# Patient Record
Sex: Female | Born: 1960 | Race: White | Hispanic: No | Smoking: Current every day smoker
Health system: Southern US, Community
[De-identification: ages and names within clinical notes are randomized; demographics above are authoritative.]

## PROBLEM LIST (undated history)

## (undated) DIAGNOSIS — I10 Essential (primary) hypertension: Secondary | ICD-10-CM

## (undated) DIAGNOSIS — E7800 Pure hypercholesterolemia, unspecified: Secondary | ICD-10-CM

## (undated) DIAGNOSIS — E119 Type 2 diabetes mellitus without complications: Secondary | ICD-10-CM

## (undated) HISTORY — PX: TONSILLECTOMY: SUR1361

## (undated) HISTORY — PX: HERNIA REPAIR: SHX51

---

## 2004-02-16 ENCOUNTER — Encounter: Payer: Self-pay | Admitting: Neurology

## 2004-06-13 ENCOUNTER — Ambulatory Visit: Payer: Self-pay | Admitting: Pain Medicine

## 2004-07-01 ENCOUNTER — Ambulatory Visit: Payer: Self-pay | Admitting: Pain Medicine

## 2004-07-18 ENCOUNTER — Ambulatory Visit: Payer: Self-pay | Admitting: Physician Assistant

## 2004-09-17 ENCOUNTER — Ambulatory Visit: Payer: Self-pay | Admitting: Physician Assistant

## 2004-09-26 ENCOUNTER — Ambulatory Visit: Payer: Self-pay | Admitting: Occupational Therapy

## 2004-10-15 ENCOUNTER — Ambulatory Visit: Payer: Self-pay | Admitting: Physician Assistant

## 2004-11-28 ENCOUNTER — Ambulatory Visit: Payer: Self-pay | Admitting: Physician Assistant

## 2004-12-09 ENCOUNTER — Ambulatory Visit: Payer: Self-pay | Admitting: Pain Medicine

## 2005-01-07 ENCOUNTER — Ambulatory Visit: Payer: Self-pay | Admitting: Physician Assistant

## 2005-01-25 ENCOUNTER — Encounter: Payer: Self-pay | Admitting: Orthopedic Surgery

## 2005-02-04 ENCOUNTER — Ambulatory Visit: Payer: Self-pay | Admitting: Physician Assistant

## 2005-02-15 ENCOUNTER — Encounter: Payer: Self-pay | Admitting: Orthopedic Surgery

## 2005-02-24 ENCOUNTER — Emergency Department: Payer: Self-pay | Admitting: Emergency Medicine

## 2005-03-04 ENCOUNTER — Ambulatory Visit: Payer: Self-pay | Admitting: Physician Assistant

## 2005-03-18 ENCOUNTER — Encounter: Payer: Self-pay | Admitting: Orthopedic Surgery

## 2005-03-31 ENCOUNTER — Ambulatory Visit: Payer: Self-pay | Admitting: Physician Assistant

## 2005-04-21 ENCOUNTER — Ambulatory Visit: Payer: Self-pay | Admitting: Pain Medicine

## 2005-05-05 ENCOUNTER — Ambulatory Visit: Payer: Self-pay | Admitting: Physician Assistant

## 2005-05-21 ENCOUNTER — Ambulatory Visit: Payer: Self-pay | Admitting: Pain Medicine

## 2005-06-03 ENCOUNTER — Ambulatory Visit: Payer: Self-pay | Admitting: Physician Assistant

## 2005-06-16 ENCOUNTER — Ambulatory Visit: Payer: Self-pay | Admitting: Pain Medicine

## 2005-06-29 ENCOUNTER — Ambulatory Visit: Payer: Self-pay | Admitting: Physician Assistant

## 2005-08-03 ENCOUNTER — Ambulatory Visit: Payer: Self-pay | Admitting: Physician Assistant

## 2005-08-31 ENCOUNTER — Ambulatory Visit: Payer: Self-pay | Admitting: Physician Assistant

## 2005-09-28 ENCOUNTER — Ambulatory Visit: Payer: Self-pay | Admitting: Physician Assistant

## 2005-10-15 ENCOUNTER — Ambulatory Visit: Payer: Self-pay | Admitting: Physician Assistant

## 2005-10-29 ENCOUNTER — Ambulatory Visit: Payer: Self-pay | Admitting: Physician Assistant

## 2005-11-04 ENCOUNTER — Ambulatory Visit: Payer: Self-pay | Admitting: Physician Assistant

## 2005-11-06 ENCOUNTER — Encounter: Payer: Self-pay | Admitting: Pain Medicine

## 2005-11-10 ENCOUNTER — Ambulatory Visit: Payer: Self-pay | Admitting: Pain Medicine

## 2005-11-15 ENCOUNTER — Encounter: Payer: Self-pay | Admitting: Pain Medicine

## 2005-11-26 ENCOUNTER — Ambulatory Visit: Payer: Self-pay | Admitting: Physician Assistant

## 2005-12-16 ENCOUNTER — Encounter: Payer: Self-pay | Admitting: Pain Medicine

## 2005-12-28 ENCOUNTER — Ambulatory Visit: Payer: Self-pay | Admitting: Physician Assistant

## 2006-01-11 ENCOUNTER — Ambulatory Visit: Payer: Self-pay | Admitting: Nurse Practitioner

## 2006-01-15 ENCOUNTER — Ambulatory Visit: Payer: Self-pay | Admitting: Nurse Practitioner

## 2006-01-16 ENCOUNTER — Encounter: Payer: Self-pay | Admitting: Pain Medicine

## 2006-01-27 ENCOUNTER — Ambulatory Visit: Payer: Self-pay | Admitting: Physician Assistant

## 2006-01-29 ENCOUNTER — Ambulatory Visit: Payer: Self-pay | Admitting: Physician Assistant

## 2006-02-15 ENCOUNTER — Encounter: Payer: Self-pay | Admitting: Pain Medicine

## 2006-02-26 ENCOUNTER — Ambulatory Visit: Payer: Self-pay | Admitting: Physician Assistant

## 2006-03-18 ENCOUNTER — Encounter: Payer: Self-pay | Admitting: Pain Medicine

## 2006-03-26 ENCOUNTER — Ambulatory Visit: Payer: Self-pay | Admitting: Physician Assistant

## 2006-04-27 ENCOUNTER — Ambulatory Visit: Payer: Self-pay | Admitting: Physician Assistant

## 2006-05-26 ENCOUNTER — Ambulatory Visit: Payer: Self-pay | Admitting: Physician Assistant

## 2006-06-25 ENCOUNTER — Ambulatory Visit: Payer: Self-pay | Admitting: Physician Assistant

## 2006-07-15 ENCOUNTER — Ambulatory Visit: Payer: Self-pay | Admitting: Nurse Practitioner

## 2006-07-26 ENCOUNTER — Ambulatory Visit: Payer: Self-pay | Admitting: Physician Assistant

## 2006-08-05 ENCOUNTER — Ambulatory Visit: Payer: Self-pay | Admitting: Pain Medicine

## 2006-08-27 ENCOUNTER — Ambulatory Visit: Payer: Self-pay | Admitting: Physician Assistant

## 2007-02-08 ENCOUNTER — Ambulatory Visit: Payer: Self-pay | Admitting: Nurse Practitioner

## 2007-02-10 ENCOUNTER — Ambulatory Visit: Payer: Self-pay | Admitting: Nurse Practitioner

## 2008-03-20 ENCOUNTER — Ambulatory Visit: Payer: Self-pay | Admitting: Nurse Practitioner

## 2009-06-24 ENCOUNTER — Encounter: Payer: Self-pay | Admitting: Nurse Practitioner

## 2009-07-11 ENCOUNTER — Ambulatory Visit: Payer: Self-pay | Admitting: Nurse Practitioner

## 2009-07-16 ENCOUNTER — Encounter: Payer: Self-pay | Admitting: Nurse Practitioner

## 2009-09-10 ENCOUNTER — Encounter: Payer: Self-pay | Admitting: Nurse Practitioner

## 2009-09-15 ENCOUNTER — Encounter: Payer: Self-pay | Admitting: Nurse Practitioner

## 2009-11-08 ENCOUNTER — Inpatient Hospital Stay (HOSPITAL_COMMUNITY): Admission: RE | Admit: 2009-11-08 | Discharge: 2009-11-11 | Payer: Self-pay | Admitting: Neurosurgery

## 2009-11-28 ENCOUNTER — Encounter: Admission: RE | Admit: 2009-11-28 | Discharge: 2009-11-28 | Payer: Self-pay | Admitting: Neurosurgery

## 2009-12-12 ENCOUNTER — Encounter: Admission: RE | Admit: 2009-12-12 | Discharge: 2009-12-12 | Payer: Self-pay | Admitting: Neurosurgery

## 2010-01-12 ENCOUNTER — Ambulatory Visit: Payer: Self-pay | Admitting: Neurosurgery

## 2010-04-01 ENCOUNTER — Ambulatory Visit: Payer: Self-pay | Admitting: Nurse Practitioner

## 2010-05-22 ENCOUNTER — Ambulatory Visit: Payer: Self-pay | Admitting: Neurosurgery

## 2010-08-03 LAB — ABO/RH: ABO/RH(D): O POS

## 2010-08-03 LAB — SURGICAL PCR SCREEN: MRSA, PCR: NEGATIVE

## 2010-08-03 LAB — TYPE AND SCREEN
ABO/RH(D): O POS
Antibody Screen: NEGATIVE

## 2010-08-03 LAB — BASIC METABOLIC PANEL
GFR calc Af Amer: >60 mL/min (ref >60)
GFR calc non Af Amer: >60 mL/min (ref >60)

## 2010-08-03 LAB — CBC: RDW: 12.8 % (ref 11.5–15.5)

## 2010-08-03 LAB — HCG, SERUM, QUALITATIVE: Preg, Serum: NEGATIVE

## 2011-04-08 ENCOUNTER — Ambulatory Visit: Payer: Self-pay | Admitting: Nurse Practitioner

## 2011-07-03 ENCOUNTER — Encounter: Payer: Self-pay | Admitting: Neurosurgery

## 2011-12-13 ENCOUNTER — Emergency Department: Payer: Self-pay | Admitting: Emergency Medicine

## 2011-12-17 ENCOUNTER — Other Ambulatory Visit: Payer: Self-pay | Admitting: Neurosurgery

## 2011-12-17 DIAGNOSIS — M545 Low back pain: Secondary | ICD-10-CM

## 2011-12-18 ENCOUNTER — Ambulatory Visit
Admission: RE | Admit: 2011-12-18 | Discharge: 2011-12-18 | Disposition: A | Discharge status: Home / Self Care | Payer: Medicaid Other | Source: Ambulatory Visit | Attending: Neurosurgery | Admitting: Neurosurgery

## 2011-12-18 DIAGNOSIS — M545 Low back pain: Secondary | ICD-10-CM

## 2011-12-18 MED ORDER — IOHEXOL 180 MG/ML  SOLN
1.0000 mL | Freq: Once | INTRAMUSCULAR | Status: AC | PRN
  Administered 2011-12-18: 1 mL via EPIDURAL

## 2011-12-18 MED ORDER — METHYLPREDNISOLONE ACETATE 40 MG/ML INJ SUSP (RADIOLOG
120.0000 mg | Freq: Once | INTRAMUSCULAR | Status: AC
  Administered 2011-12-18: 120 mg via EPIDURAL

## 2012-02-15 ENCOUNTER — Other Ambulatory Visit: Payer: Self-pay | Admitting: Neurosurgery

## 2012-02-15 DIAGNOSIS — M792 Neuralgia and neuritis, unspecified: Secondary | ICD-10-CM

## 2012-02-15 DIAGNOSIS — M48061 Spinal stenosis, lumbar region without neurogenic claudication: Secondary | ICD-10-CM

## 2012-03-09 ENCOUNTER — Other Ambulatory Visit: Payer: Medicaid Other

## 2012-03-11 ENCOUNTER — Ambulatory Visit
Admission: RE | Admit: 2012-03-11 | Discharge: 2012-03-11 | Disposition: A | Discharge status: Home / Self Care | Payer: Medicaid Other | Source: Ambulatory Visit | Attending: Neurosurgery | Admitting: Neurosurgery

## 2012-03-11 VITALS — BP 108/69 | HR 80 | Ht 62.0 in | Wt 195.0 lb

## 2012-03-11 DIAGNOSIS — M48061 Spinal stenosis, lumbar region without neurogenic claudication: Secondary | ICD-10-CM

## 2012-03-11 DIAGNOSIS — M792 Neuralgia and neuritis, unspecified: Secondary | ICD-10-CM

## 2012-03-11 MED ORDER — IOHEXOL 180 MG/ML  SOLN
15.0000 mL | Freq: Once | INTRAMUSCULAR | Status: AC | PRN
  Administered 2012-03-11: 15 mL via INTRATHECAL

## 2012-03-11 MED ORDER — ONDANSETRON HCL 4 MG/2ML IJ SOLN: 4.0000 mg | Freq: Once | INTRAMUSCULAR | Status: DC

## 2012-03-11 MED ORDER — DIAZEPAM 5 MG PO TABS
10.0000 mg | ORAL_TABLET | Freq: Once | ORAL | Status: AC
  Administered 2012-03-11: 10 mg via ORAL

## 2012-03-11 MED ORDER — MEPERIDINE HCL 100 MG/ML IJ SOLN: 75.0000 mg | Freq: Once | INTRAMUSCULAR | Status: DC

## 2012-03-11 NOTE — Progress Notes (Signed)
Pt states she has been off imipramine for the past 2 days.dd

## 2012-04-20 ENCOUNTER — Ambulatory Visit: Payer: Self-pay | Admitting: Nurse Practitioner

## 2012-08-29 ENCOUNTER — Emergency Department: Payer: Self-pay | Admitting: Emergency Medicine

## 2012-09-20 ENCOUNTER — Other Ambulatory Visit: Payer: Self-pay | Admitting: Neurology

## 2013-05-04 ENCOUNTER — Ambulatory Visit: Payer: Self-pay

## 2014-04-02 IMAGING — RF DG MYELOGRAM LUMBAR
6 of 17 series · 6 of 17 positions shown · IV contrast (omnipaque)
Comparison: MRI of the lumbar spine 01/12/2010 at [REDACTED].

CLINICAL DATA: New left lower extremity pain extending along the
lateral aspect of the thigh to the knee.  Prior to surgery the
patient had right lower extremity pain.  She now also suffers from
bilateral lower extremity radiculitis with abnormal temperature
sure sensations in both feet and ankles.

MYELOGRAM INJECTION
TECHNIQUE: Informed consent was obtained from the patient prior to
the procedure, including potential complications of headache,
allergy, infection and pain.  A timeout procedure was performed.
With the patient prone, the lower back was prepped with Betadine.
1% Lidocaine was used for local anesthesia.  Lumbar puncture was
performed at the right paramidline L2-3 level using a 22 gauge
needle with return of clear CSF.  15 ml of Omnipaque 013was
injected into the subarachnoid space .
TECHNIQUE: I personally performed the lumbar puncture and
administered the intrathecal contrast. I also personally supervised
acquisition of the myelogram images. Following injection of
intrathecal Omnipaque contrast, spine imaging in multiple
projections was performed using fluoroscopy.
Fluoroscopy Time: 37 seconds.
TECHNIQUE: CT imaging of the lumbar spine was performed after
intrathecal contrast administration.  Multiplanar CT image
reconstructions were also generated.

[Series 1: (hospital) · 1 of 1 slices shown]
[im 1/1]
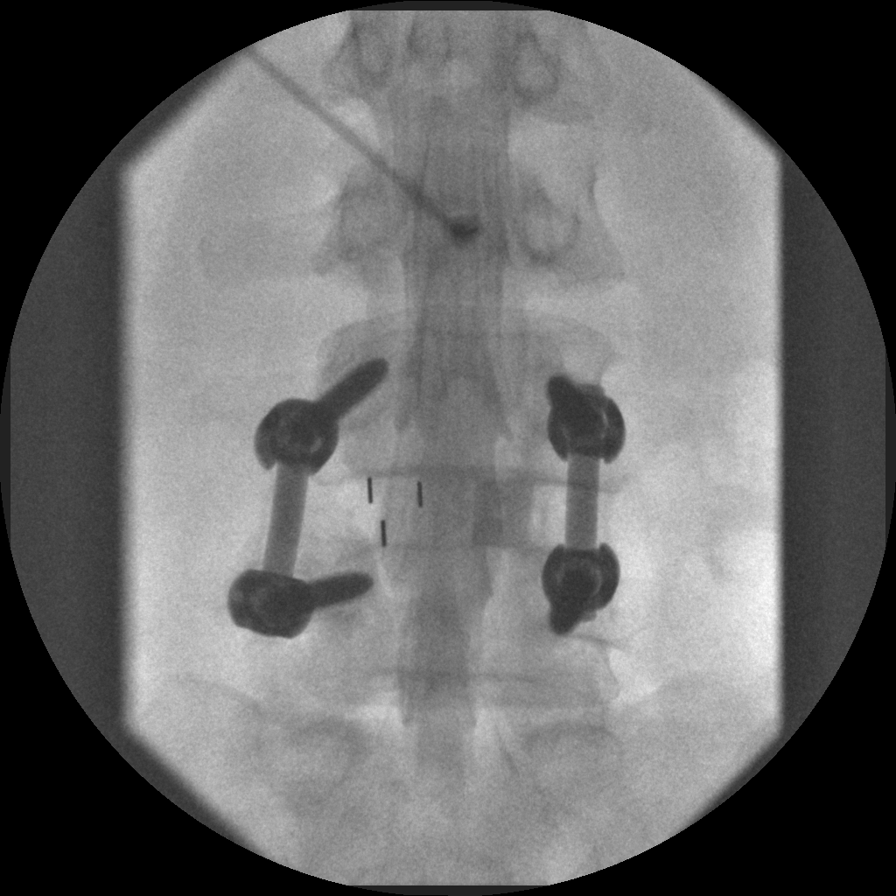

[Series 2: myelogram  white · 1 of 1 slices shown (1 of 5)]
[im 1/1]
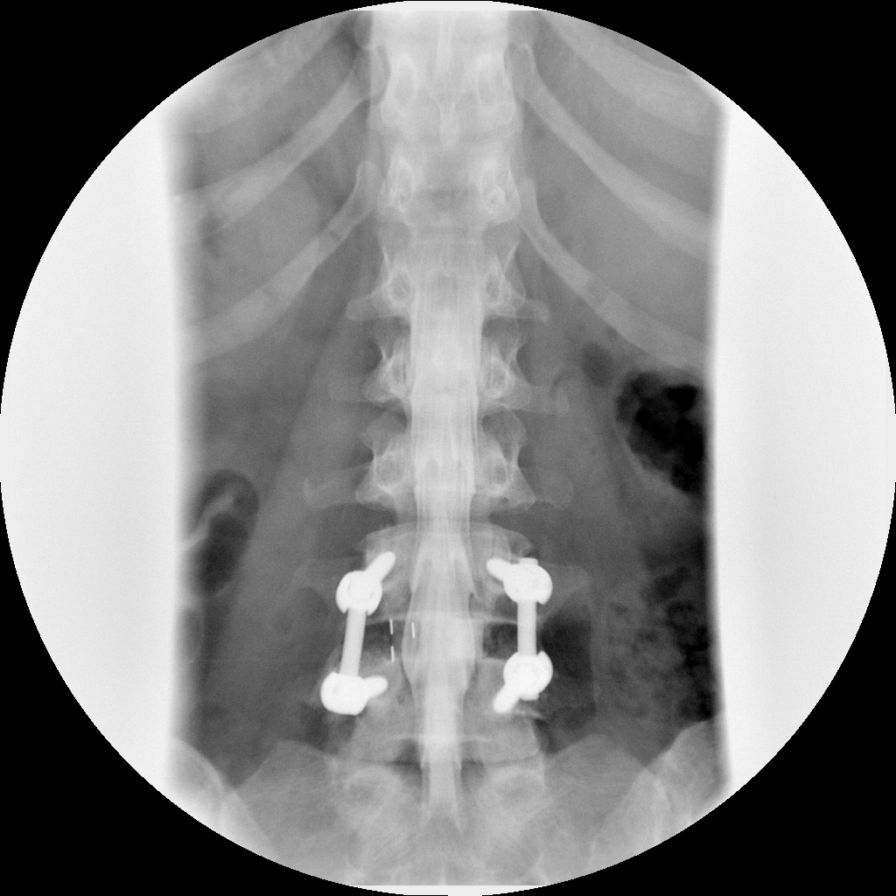

[Series 3: myelogram  white · 1 of 1 slices shown (2 of 5)]
[im 1/1]
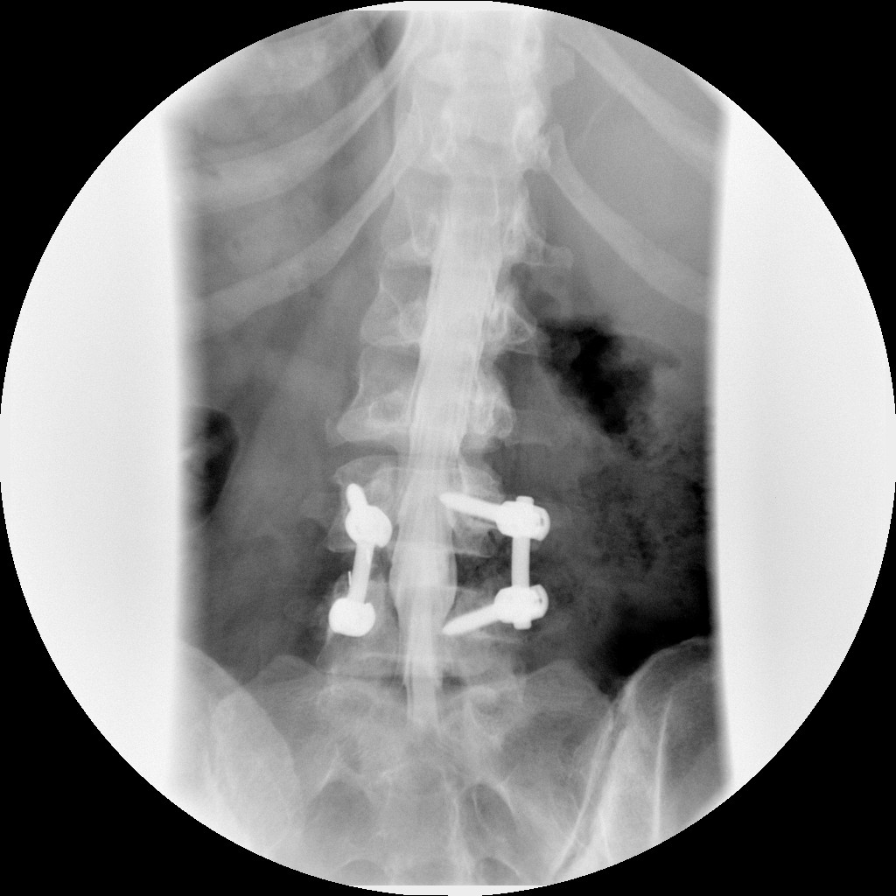

[Series 4: myelogram  white · 1 of 1 slices shown (3 of 5)]
[im 1/1]
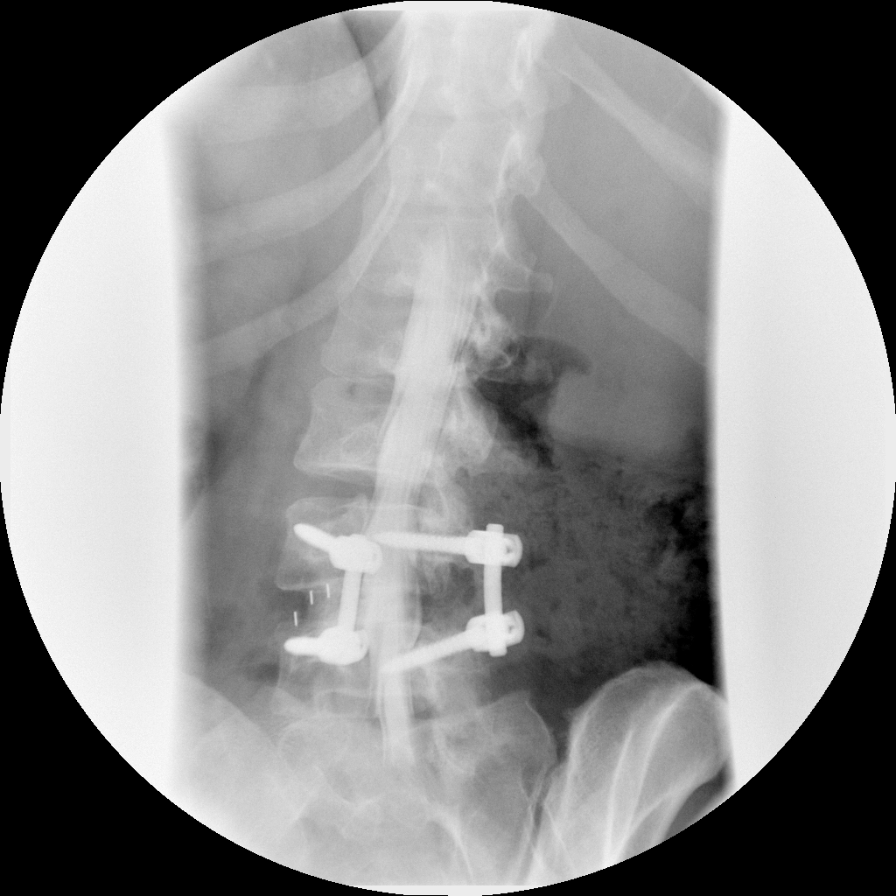

[Series 5: myelogram  white · 1 of 1 slices shown (4 of 5)]
[im 1/1]
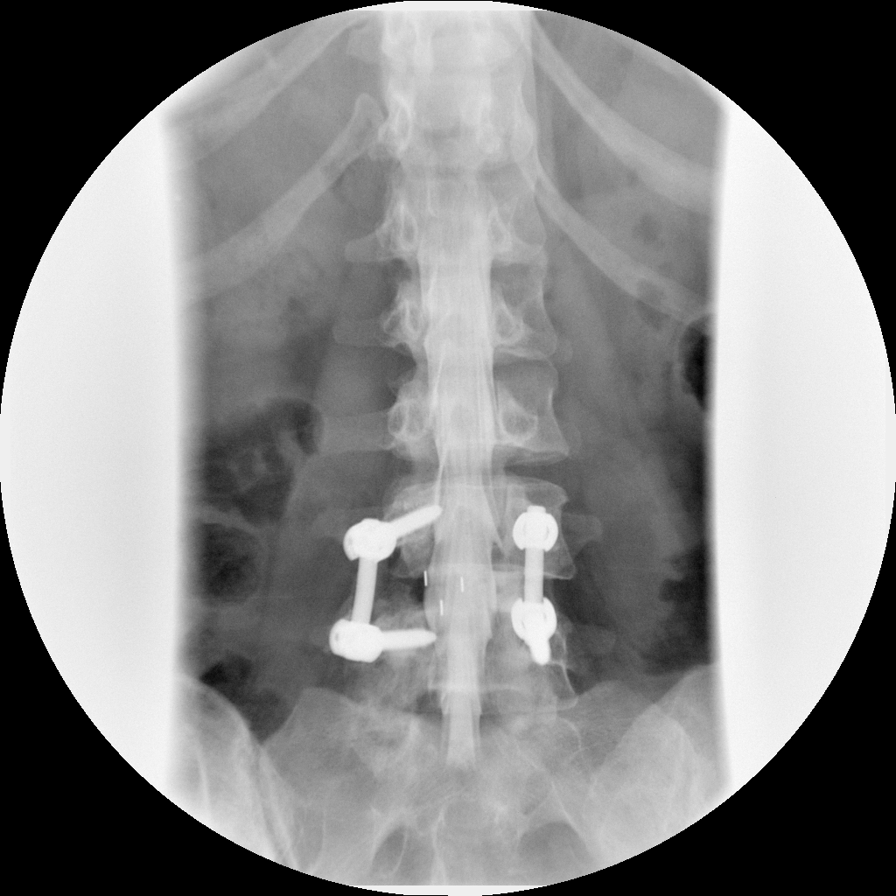

[Series 6: myelogram  white · 1 of 1 slices shown (5 of 5)]
[im 1/1]
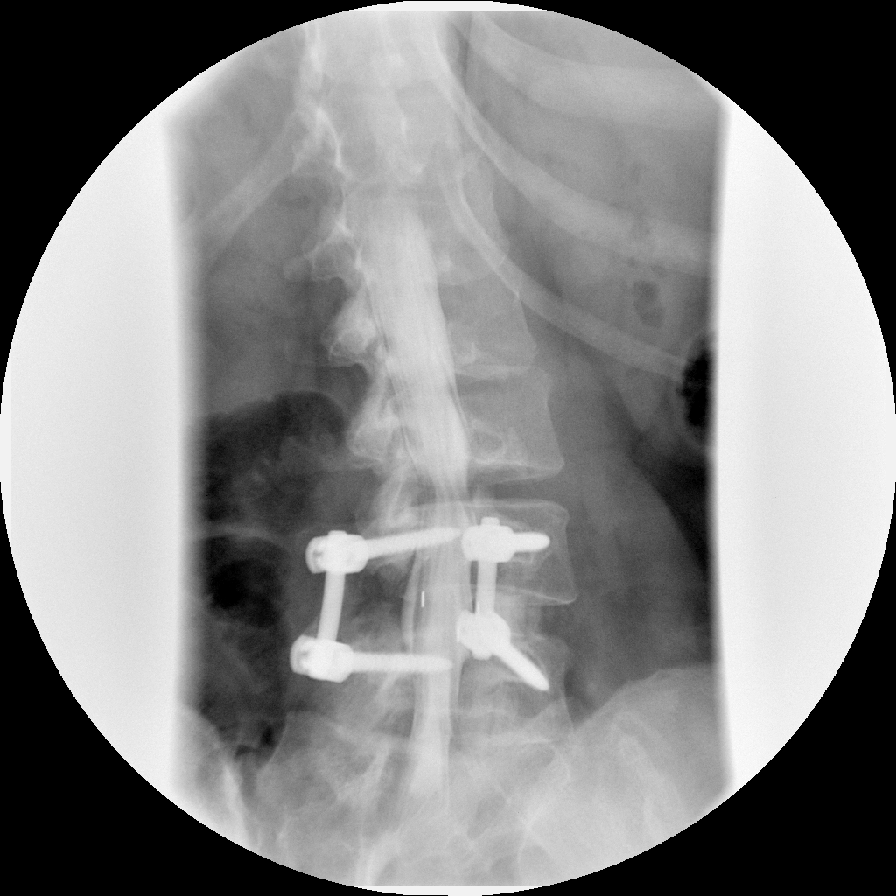

[6 of 17 positions shown; findings below may reference images not displayed]

IMPRESSION: Successful injection of  intrathecal contrast for myelography.

MYELOGRAM LUMBAR
FINDINGS: The patient is status post L4-5 fusion.  The fusion
appears mature.  Hardware is intact.  There is slight
retrolisthesis at L3-4 with a broad-based disc herniation,
asymmetric to the left. This results in mild to moderate central
canal stenosis with lateral recess narrowing bilaterally, left
greater than right.  There is medial deviation of the traversing
nerve roots.

The retrolisthesis and disc herniation is not changed significantly
with standing.  There is some reduction of the disc herniation with
flexion.
IMPRESSION: 1.  Dynamic retrolisthesis and disc herniation at L3-4, adjacent to
the fusion, with mild moderate central canal stenosis and lateral
recess narrowing, left greater than right.
2.  Status post fusion at L4-5 without evidence for residual or
recurrent stenosis.


CT MYELOGRAPHY LUMBAR SPINE
FINDINGS: The patient is status post L4-5 PLIF.  Fusion across the
disc space is solid.  Hardware is intact.

The lumbar spine is imaged from T11-12 through S2-3.  Slight
rightward curvature is evident at L4-5.  The conus medullaris
terminates at L1-2, within normal limits.

Slight retrolisthesis is evident at L3-4, slightly reduced from the
myelographic images.

Limited imaging of the abdomen is unremarkable.

The disc levels at L2-3 above are normal.

L3-4:  A leftward broad-based disc herniation results in mild left
lateral recess and mild to moderate left foraminal stenosis,
potentially affecting the left L4 and L3 nerve roots.

L4-5:  The patient is status post wide laminectomy.  No residual or
recurrent stenosis is evident.

L5-S1:  Moderate facet hypertrophy is present bilaterally.  There
is slight disc bulging.  This results in minimal foraminal
narrowing bilaterally.  The central canal is patent.
IMPRESSION: 1.  Progressive leftward disc herniation and facet disease at L3-4
results in mild left lateral recess and mild to moderate left
foraminal stenosis, potentially affecting the left L3 and/or L4
nerve roots.
2.  Status post fusion at L4-5 without evidence for residual or
recurrent stenosis.
3.  Minimal foraminal narrowing bilaterally at L5-S1 secondary to
slight disc bulging and facet hypertrophy.

## 2014-09-20 IMAGING — CR DG LUMBAR SPINE 2-3V
1 series · 3 of 3 positions shown · non-contrast
Comparison: none

REASON FOR EXAM: pain, h/o lumbar rods
COMMENTS:

PROCEDURE:     DXR - DXR LUMBAR SPINE AP AND LATERAL  - August 29, 2012 [DATE]
RESULT:     There are pedicle screws with stabilization rods posteriorly at
the L4-L5 level with intervertebral prosthesis. Alignment appears to be
maintained. There is no acute bony abnormality or hardware abnormality.

[Series 1: ap · 0.17mm/px · 3 of 3 slices shown]
[im 1/3]
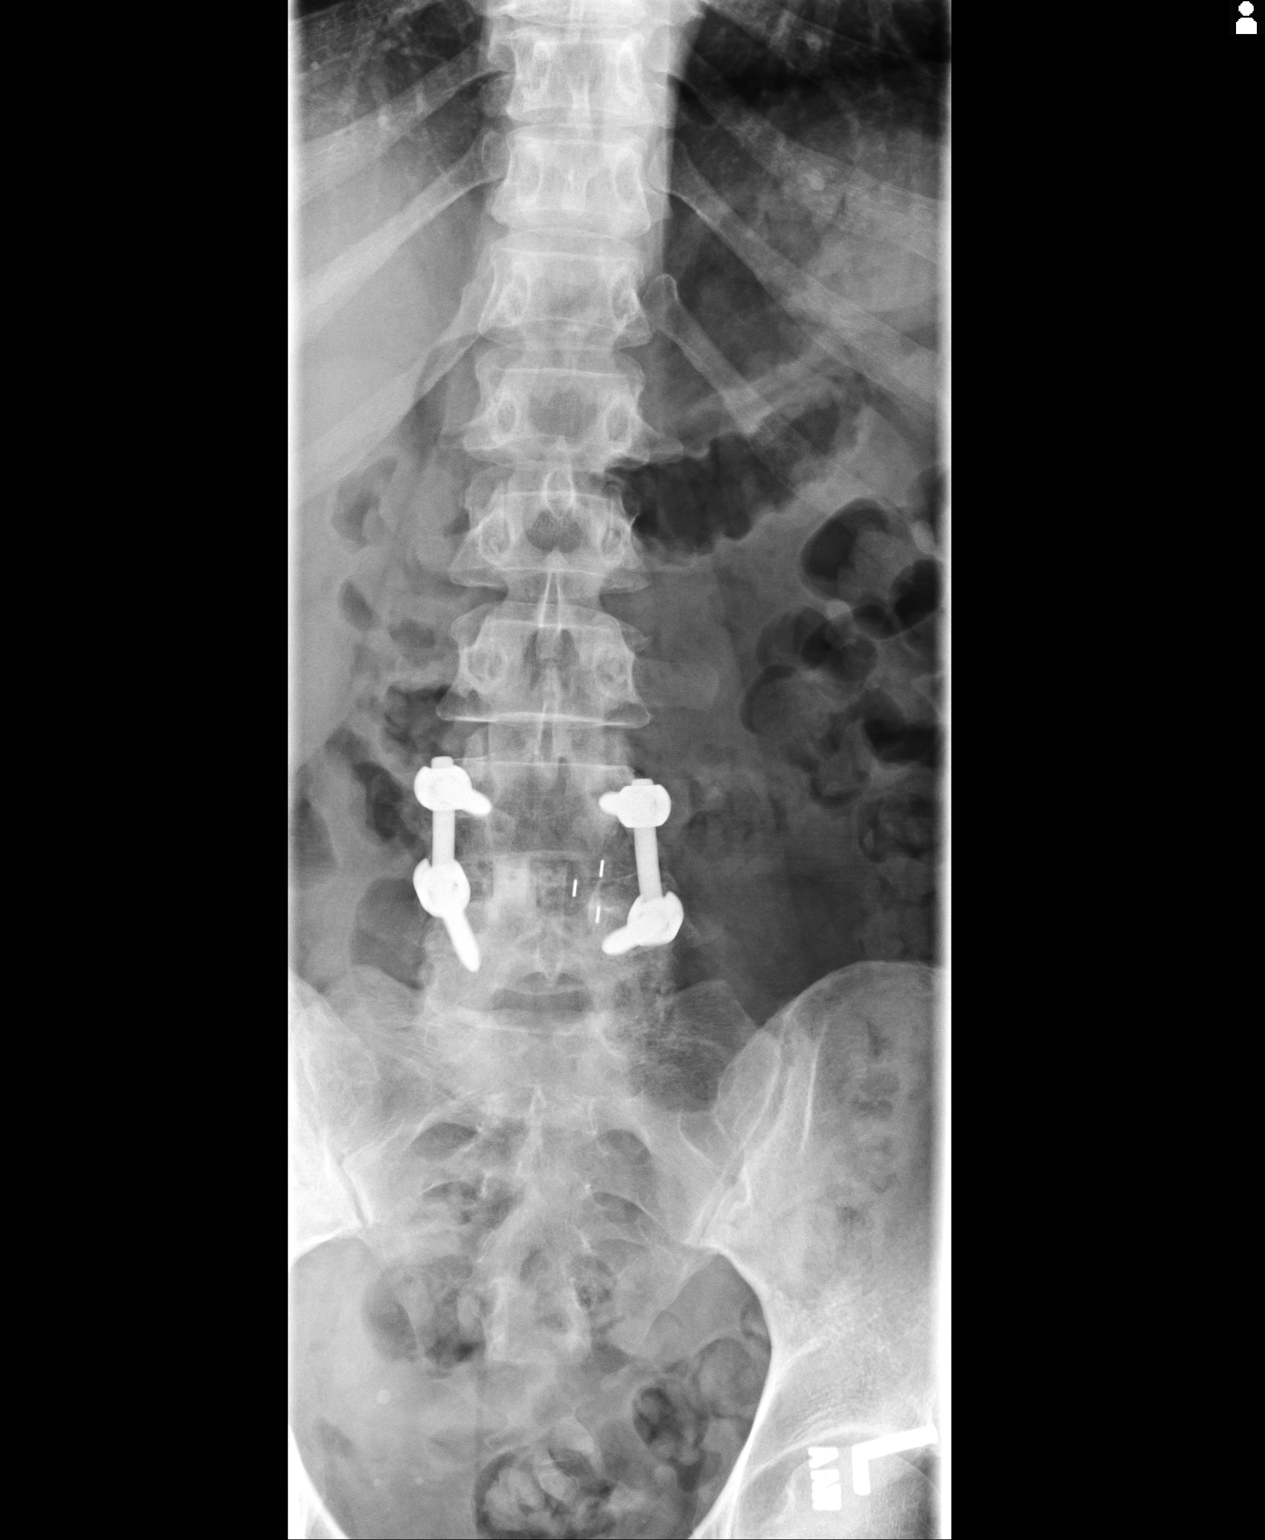
[im 2/3]
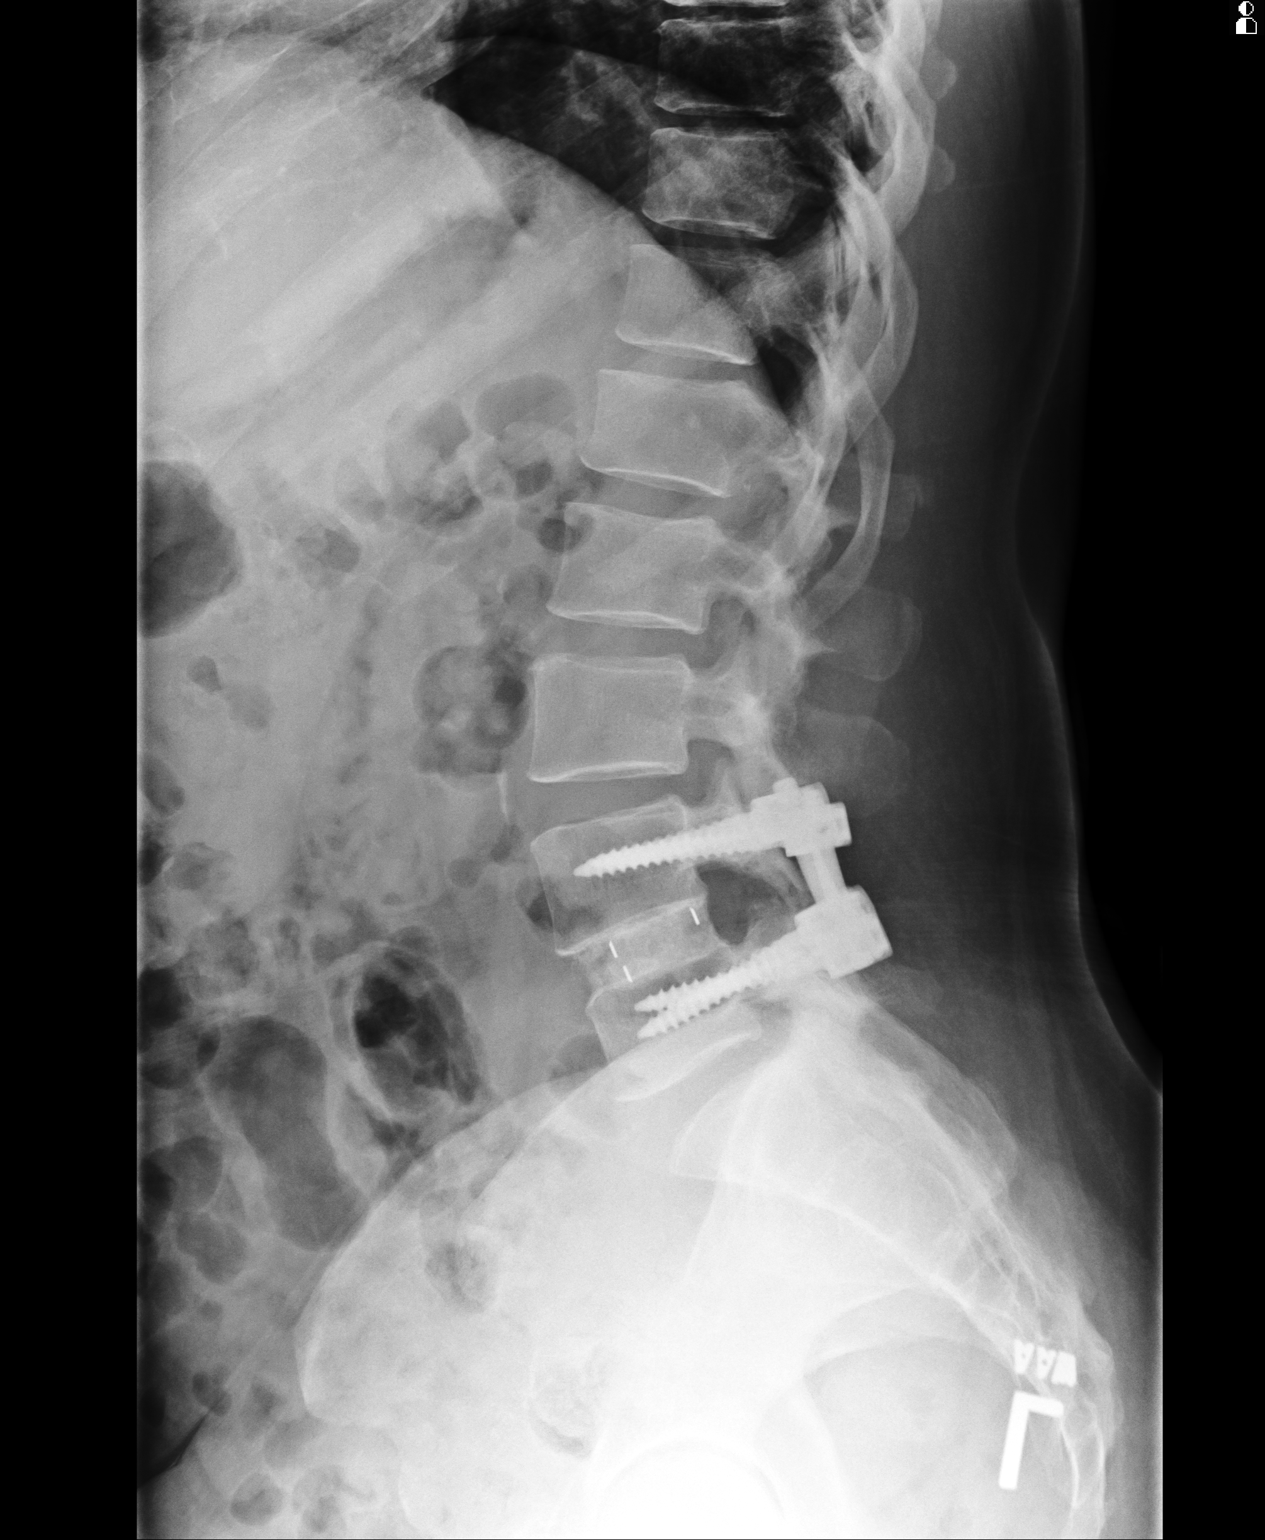
[im 3/3]
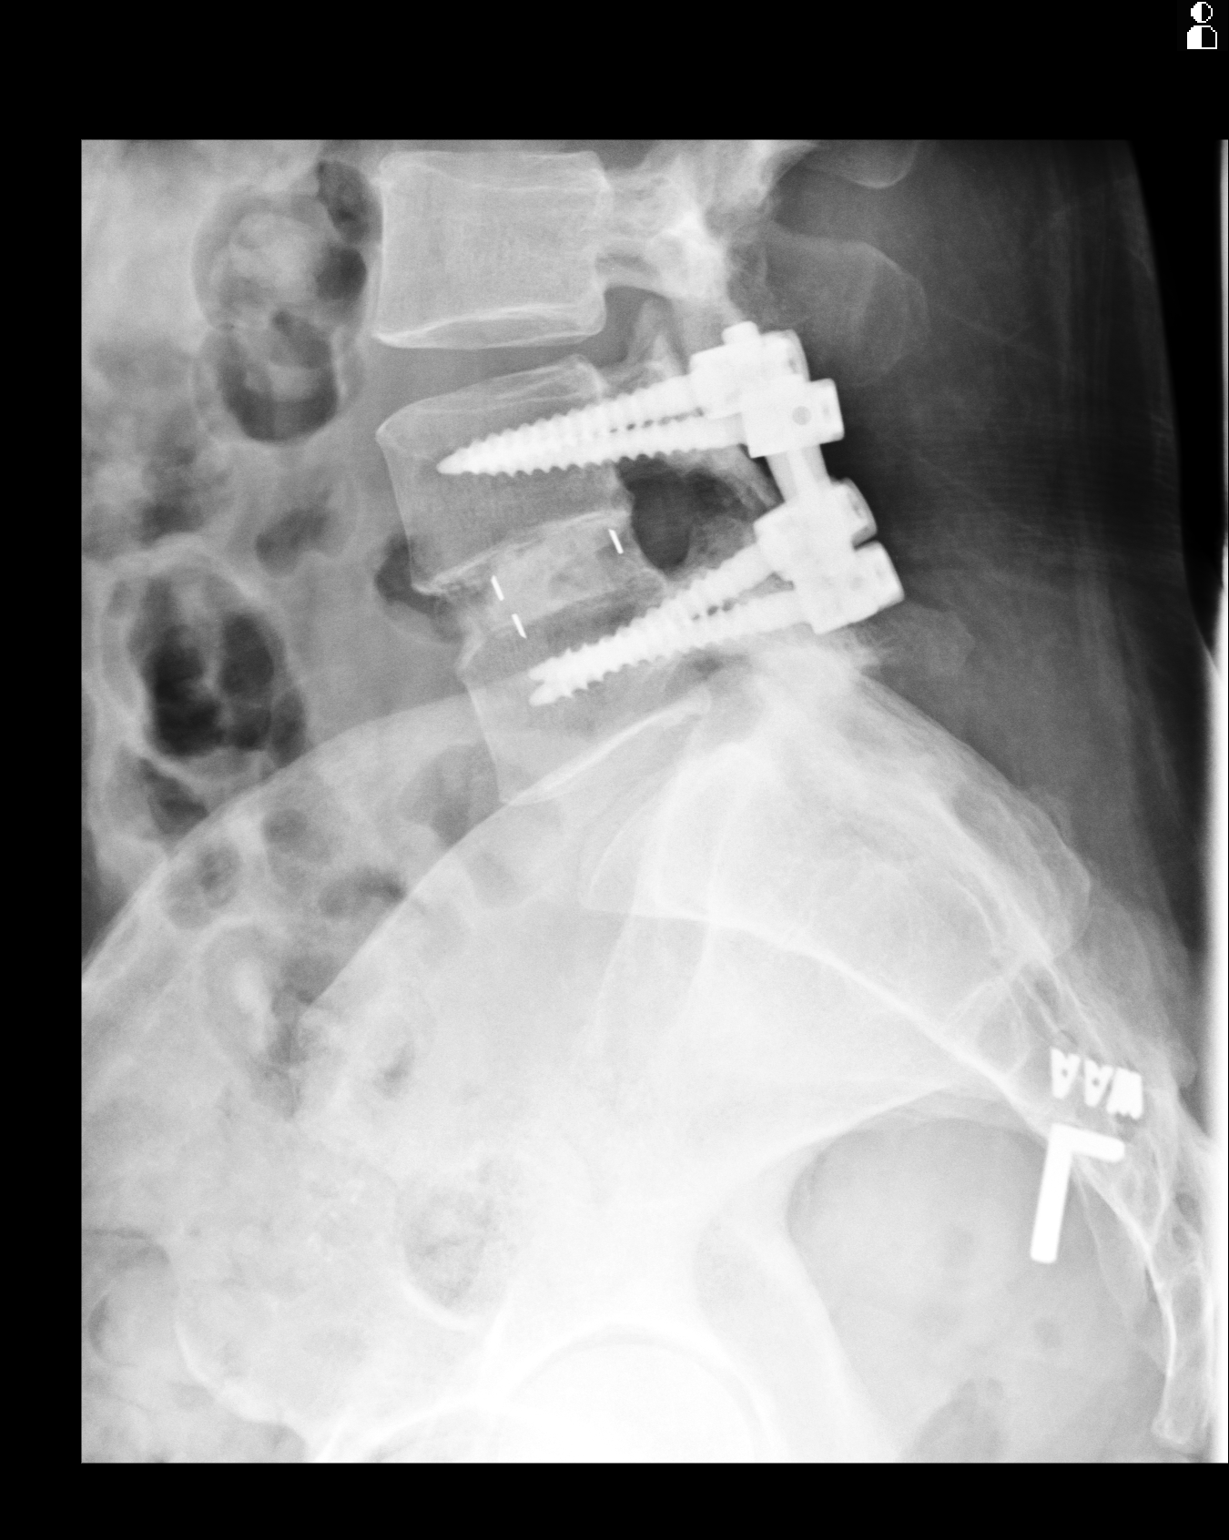

[3 of 3 positions shown; findings below may reference images not displayed]

IMPRESSION: Postoperative change. No acute bony abnormality evident.

[REDACTED]

## 2015-02-19 ENCOUNTER — Other Ambulatory Visit: Payer: Self-pay | Admitting: Nurse Practitioner

## 2015-02-19 DIAGNOSIS — Z1239 Encounter for other screening for malignant neoplasm of breast: Secondary | ICD-10-CM

## 2015-02-28 ENCOUNTER — Ambulatory Visit: Payer: Medicaid Other

## 2015-03-14 ENCOUNTER — Ambulatory Visit: Payer: Medicaid Other | Attending: Nurse Practitioner

## 2015-08-24 ENCOUNTER — Encounter: Payer: Self-pay | Admitting: *Deleted

## 2015-08-24 ENCOUNTER — Inpatient Hospital Stay
Admission: RE | Admit: 2015-08-24 | Discharge: 2015-08-27 | DRG: 897 | Disposition: A | Discharge status: Home / Self Care | Payer: Medicaid Other | Source: Intra-hospital | Attending: Psychiatry | Admitting: Psychiatry

## 2015-08-24 ENCOUNTER — Emergency Department
Admission: EM | Admit: 2015-08-24 | Discharge: 2015-08-24 | Disposition: A | Discharge status: Other Acute Inpatient Hospital | Payer: Medicaid Other | Attending: Emergency Medicine | Admitting: Emergency Medicine

## 2015-08-24 DIAGNOSIS — E119 Type 2 diabetes mellitus without complications: Secondary | ICD-10-CM | POA: Insufficient documentation

## 2015-08-24 DIAGNOSIS — G4700 Insomnia, unspecified: Secondary | ICD-10-CM | POA: Diagnosis present

## 2015-08-24 DIAGNOSIS — Z79899 Other long term (current) drug therapy: Secondary | ICD-10-CM

## 2015-08-24 DIAGNOSIS — F22 Delusional disorders: Secondary | ICD-10-CM | POA: Diagnosis present

## 2015-08-24 DIAGNOSIS — F19950 Other psychoactive substance use, unspecified with psychoactive substance-induced psychotic disorder with delusions: Secondary | ICD-10-CM | POA: Diagnosis present

## 2015-08-24 DIAGNOSIS — F1120 Opioid dependence, uncomplicated: Secondary | ICD-10-CM | POA: Diagnosis present

## 2015-08-24 DIAGNOSIS — F17200 Nicotine dependence, unspecified, uncomplicated: Secondary | ICD-10-CM | POA: Diagnosis present

## 2015-08-24 DIAGNOSIS — I10 Essential (primary) hypertension: Secondary | ICD-10-CM | POA: Insufficient documentation

## 2015-08-24 DIAGNOSIS — G8929 Other chronic pain: Secondary | ICD-10-CM | POA: Diagnosis present

## 2015-08-24 DIAGNOSIS — K219 Gastro-esophageal reflux disease without esophagitis: Secondary | ICD-10-CM | POA: Diagnosis present

## 2015-08-24 DIAGNOSIS — F1420 Cocaine dependence, uncomplicated: Secondary | ICD-10-CM | POA: Diagnosis present

## 2015-08-24 DIAGNOSIS — R1030 Lower abdominal pain, unspecified: Secondary | ICD-10-CM | POA: Diagnosis not present

## 2015-08-24 DIAGNOSIS — F17210 Nicotine dependence, cigarettes, uncomplicated: Secondary | ICD-10-CM | POA: Insufficient documentation

## 2015-08-24 DIAGNOSIS — F600 Paranoid personality disorder: Secondary | ICD-10-CM | POA: Diagnosis present

## 2015-08-24 DIAGNOSIS — Z880 Allergy status to penicillin: Secondary | ICD-10-CM

## 2015-08-24 DIAGNOSIS — F29 Unspecified psychosis not due to a substance or known physiological condition: Secondary | ICD-10-CM | POA: Diagnosis not present

## 2015-08-24 DIAGNOSIS — Z7951 Long term (current) use of inhaled steroids: Secondary | ICD-10-CM | POA: Diagnosis not present

## 2015-08-24 DIAGNOSIS — Z9889 Other specified postprocedural states: Secondary | ICD-10-CM | POA: Diagnosis not present

## 2015-08-24 DIAGNOSIS — K5900 Constipation, unspecified: Secondary | ICD-10-CM | POA: Diagnosis present

## 2015-08-24 DIAGNOSIS — F19959 Other psychoactive substance use, unspecified with psychoactive substance-induced psychotic disorder, unspecified: Principal | ICD-10-CM | POA: Diagnosis present

## 2015-08-24 HISTORY — DX: Pure hypercholesterolemia, unspecified: E78.00

## 2015-08-24 HISTORY — DX: Type 2 diabetes mellitus without complications: E11.9

## 2015-08-24 HISTORY — DX: Essential (primary) hypertension: I10

## 2015-08-24 LAB — COMPREHENSIVE METABOLIC PANEL
ALK PHOS: 93 U/L (ref 38–126)
ALT: 22 U/L (ref 14–54)
AST: 25 U/L (ref 15–41)
Albumin: 4.3 g/dL (ref 3.5–5.0)
Anion gap: 6 (ref 5–15)
BUN: 29 mg/dL — AB (ref 6–20)
CALCIUM: 8.8 mg/dL — AB (ref 8.9–10.3)
CHLORIDE: 99 mmol/L — AB (ref 101–111)
CO2: 27 mmol/L (ref 22–32)
CREATININE: 0.9 mg/dL (ref 0.44–1.00)
GFR calc Af Amer: >60 mL/min (ref >60)
GFR calc non Af Amer: >60 mL/min (ref >60)
Glucose, Bld: 113 mg/dL — ABNORMAL HIGH (ref 65–99)
Potassium: 3.6 mmol/L (ref 3.5–5.1)
SODIUM: 132 mmol/L — AB (ref 135–145)
Total Bilirubin: 0.2 mg/dL — ABNORMAL LOW (ref 0.3–1.2)
Total Protein: 6.7 g/dL (ref 6.5–8.1)

## 2015-08-24 LAB — URINE DRUG SCREEN, QUALITATIVE (ARMC ONLY)
AMPHETAMINES, UR SCREEN: NOT DETECTED
Barbiturates, Ur Screen: NOT DETECTED
Benzodiazepine, Ur Scrn: NOT DETECTED
COCAINE METABOLITE, UR ~~LOC~~: POSITIVE — AB
Cannabinoid 50 Ng, Ur ~~LOC~~: NOT DETECTED
MDMA (ECSTASY) UR SCREEN: NOT DETECTED
METHADONE SCREEN, URINE: NOT DETECTED
OPIATE, UR SCREEN: POSITIVE — AB
PHENCYCLIDINE (PCP) UR S: NOT DETECTED
Tricyclic, Ur Screen: NOT DETECTED

## 2015-08-24 LAB — CBC WITH DIFFERENTIAL/PLATELET
Basophils Absolute: 0.1 10*3/uL (ref 0–0.1)
Basophils Relative: 1 %
EOS ABS: 0.2 10*3/uL (ref 0–0.7)
EOS PCT: 2 %
HCT: 39.2 % (ref 35.0–47.0)
Hemoglobin: 13.3 g/dL (ref 12.0–16.0)
LYMPHS ABS: 3.9 10*3/uL — AB (ref 1.0–3.6)
LYMPHS PCT: 41 %
MCH: 30 pg (ref 26.0–34.0)
MCHC: 33.9 g/dL (ref 32.0–36.0)
MCV: 88.4 fL (ref 80.0–100.0)
MONO ABS: 0.7 10*3/uL (ref 0.2–0.9)
MONOS PCT: 8 %
Neutro Abs: 4.7 10*3/uL (ref 1.4–6.5)
Neutrophils Relative %: 48 %
PLATELETS: 215 10*3/uL (ref 150–440)
RBC: 4.43 MIL/uL (ref 3.80–5.20)
RDW: 13.3 % (ref 11.5–14.5)
WBC: 9.5 10*3/uL (ref 3.6–11.0)

## 2015-08-24 LAB — URINALYSIS COMPLETE WITH MICROSCOPIC (ARMC ONLY)
Bacteria, UA: NONE SEEN
Bilirubin Urine: NEGATIVE
Glucose, UA: NEGATIVE mg/dL
Hgb urine dipstick: NEGATIVE
Ketones, ur: NEGATIVE mg/dL
Nitrite: NEGATIVE
PROTEIN: NEGATIVE mg/dL
Specific Gravity, Urine: 1.017 (ref 1.005–1.030)
pH: 7 (ref 5.0–8.0)

## 2015-08-24 LAB — ACETAMINOPHEN LEVEL: Acetaminophen (Tylenol), Serum: <10 ug/mL — ABNORMAL LOW (ref 10–30)

## 2015-08-24 LAB — ETHANOL: Alcohol, Ethyl (B): <5 mg/dL (ref <5)

## 2015-08-24 LAB — SALICYLATE LEVEL: Salicylate Lvl: <4 mg/dL (ref 2.8–30.0)

## 2015-08-24 MED ORDER — DOCUSATE SODIUM 100 MG PO CAPS
100.0000 mg | ORAL_CAPSULE | Freq: Every day | ORAL | Status: DC
Start: 2015-08-24 — End: 2015-08-24
  Administered 2015-08-24: 100 mg via ORAL

## 2015-08-24 MED ORDER — GABAPENTIN 400 MG PO CAPS
800.0000 mg | ORAL_CAPSULE | Freq: Every day | ORAL | Status: DC
  Administered 2015-08-25 – 2015-08-26 (×2): 800 mg via ORAL
  Filled 2015-08-24 (×2): qty 2

## 2015-08-24 MED ORDER — MAGNESIUM HYDROXIDE 400 MG/5ML PO SUSP: 30.0000 mL | Freq: Every day | ORAL | Status: DC | PRN

## 2015-08-24 MED ORDER — FLUTICASONE PROPIONATE 50 MCG/ACT NA SUSP
2.0000 | Freq: Every day | NASAL | Status: DC
  Administered 2015-08-25 – 2015-08-27 (×3): 2 via NASAL
  Filled 2015-08-24: qty 16

## 2015-08-24 MED ORDER — BUPRENORPHINE HCL 2 MG SL SUBL
4.0000 mg | SUBLINGUAL_TABLET | Freq: Every day | SUBLINGUAL | Status: DC
  Administered 2015-08-25 – 2015-08-26 (×2): 4 mg via SUBLINGUAL
  Filled 2015-08-24 (×2): qty 2

## 2015-08-24 MED ORDER — DOCUSATE SODIUM 100 MG PO CAPS
100.0000 mg | ORAL_CAPSULE | Freq: Every day | ORAL | Status: DC
  Administered 2015-08-25 – 2015-08-27 (×3): 100 mg via ORAL
  Filled 2015-08-24 (×3): qty 1

## 2015-08-24 MED ORDER — LORATADINE 10 MG PO TABS
10.0000 mg | ORAL_TABLET | Freq: Every day | ORAL | Status: DC
  Administered 2015-08-25 – 2015-08-27 (×3): 10 mg via ORAL
  Filled 2015-08-24 (×3): qty 1

## 2015-08-24 MED ORDER — ALUM & MAG HYDROXIDE-SIMETH 200-200-20 MG/5ML PO SUSP: 30.0000 mL | ORAL | Status: DC | PRN

## 2015-08-24 MED ORDER — PANTOPRAZOLE SODIUM 40 MG PO TBEC
40.0000 mg | DELAYED_RELEASE_TABLET | Freq: Every day | ORAL | Status: DC
  Administered 2015-08-25 – 2015-08-27 (×3): 40 mg via ORAL
  Filled 2015-08-24 (×3): qty 1

## 2015-08-24 MED ORDER — DULOXETINE HCL 30 MG PO CPEP
30.0000 mg | ORAL_CAPSULE | Freq: Every day | ORAL | Status: DC
  Administered 2015-08-25 – 2015-08-27 (×3): 30 mg via ORAL
  Filled 2015-08-24 (×3): qty 1

## 2015-08-24 MED ORDER — NICOTINE 21 MG/24HR TD PT24
21.0000 mg | MEDICATED_PATCH | Freq: Every day | TRANSDERMAL | Status: DC
  Filled 2015-08-24 (×2): qty 1

## 2015-08-24 MED ORDER — ACETAMINOPHEN 325 MG PO TABS
650.0000 mg | ORAL_TABLET | Freq: Four times a day (QID) | ORAL | Status: DC | PRN
  Administered 2015-08-25 – 2015-08-26 (×2): 650 mg via ORAL
  Filled 2015-08-24 (×2): qty 2

## 2015-08-24 MED ORDER — TRAZODONE HCL 100 MG PO TABS
100.0000 mg | ORAL_TABLET | Freq: Every day | ORAL | Status: DC
  Filled 2015-08-24 (×2): qty 1

## 2015-08-24 NOTE — ED Notes (Signed)
Patient left unit in stable condition in wheelchair with this RN accompanied by officer with personal belongings. Patient denies SI/HI. Patient upset over admission to inpatient stating she has no one to call to take care of her twenty dogs.This RN suggested that patient discuss concerns with her doctor. Patient verbalized understanding.Patient remains free from self harm.

## 2015-08-24 NOTE — ED Notes (Signed)
C/O constipation.  Medication administered with prune juice.
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
## 2015-08-24 NOTE — ED Provider Notes (Signed)
[facility] Emergency Department Provider Note  ____________________________________________  Time seen: Approximately 7:20 AM  I have reviewed the triage vital signs and the nursing notes.   HISTORY  Chief Complaint Paranoid   HPI Monica Pope is a 55 y.o. female with a history of diabetes and hypertension who is presenting under involuntary commitment by the sheriff. The patient has been exhibiting paranoid behavior and says that her pupil on her property that are "coming to get her." She is also complaining that she has not been able to sleep in 2 weeks and then arouses full mold. Because of the mold, she says that she has been sleeping on the bathroom floor and has some pain to her groin. Patient is denying any suicidal or homicidal ideation. Says that she drinks occasionally and hasn't had a drink in "days." Denies any drugs.   Past Medical History  Diagnosis Date  . Diabetes mellitus without complication (HCC)   . Hypertension   . Hypercholesterolemia     There are no active problems to display for this patient.   Past Surgical History  Procedure Laterality Date  . Hernia repair    . Tonsillectomy      Current Outpatient Rx  Name  Route  Sig  Dispense  Refill  . gabapentin (NEURONTIN) 800 MG tablet   Oral   Take 800 mg by mouth 3 (three) times daily.         Marland Kitchen. morphine (MSIR) 30 MG tablet   Oral   Take 30 mg by mouth every 6 (six) hours as needed for severe pain.         Marland Kitchen. VOLTAREN 1 % GEL      APPLY AS DIRECTED AS NEEDED FOR PAIN.   100 g   0     Please Schedule Appt     Allergies Amoxicillin  History reviewed. No pertinent family history.  Social History Social History  Substance Use Topics  . Smoking status: Current Every Day Smoker -- 0.50 packs/day for 5 years    Types: Cigarettes  . Smokeless tobacco: Never Used  . Alcohol Use: Yes     Comment: occasionally    Review of Systems Constitutional: No  fever/chills Eyes: No visual changes. ENT: No sore throat. Cardiovascular: Denies chest pain. Respiratory: Denies shortness of breath. Gastrointestinal: No abdominal pain.  No nausea, no vomiting.  No diarrhea.  No constipation. Genitourinary: Negative for dysuria. Musculoskeletal: Negative for back pain. Skin: Negative for rash. Neurological: Negative for headaches, focal weakness or numbness.  10-point ROS otherwise negative.  ____________________________________________   PHYSICAL EXAM:  VITAL SIGNS: ED Triage Vitals  Enc Vitals Group     BP 08/24/15 0524 85/51 mmHg     Pulse Rate 08/24/15 0524 72     Resp 08/24/15 0524 22     Temp 08/24/15 0524 97.6 F (36.4 C)     Temp Source 08/24/15 0524 Oral     SpO2 08/24/15 0524 96 %     Weight 08/24/15 0524 130 lb (58.968 kg)     Height 08/24/15 0524 5\' 2"  (1.575 m)     Head Cir --      Peak Flow --      Pain Score 08/24/15 0525 9     Pain Loc --      Pain Edu? --      Excl. in GC? --     Constitutional: Alert and oriented. Sleeping when I am in the room but easily arousable.  Eyes: Conjunctivae are normal. PERRL. EOMI. Head: Atraumatic. Nose: No congestion/rhinnorhea. Mouth/Throat: Mucous membranes are moist.  Oropharynx non-erythematous. Neck: No stridor.   Cardiovascular: Normal rate, regular rhythm. Grossly normal heart sounds.  Good peripheral circulation. Respiratory: Normal respiratory effort.  No retractions. Lungs CTAB. Gastrointestinal: Soft and nontender. No distention.  Musculoskeletal: No lower extremity tenderness nor edema.  No joint effusions. Neurologic:  Normal speech and language. No gross focal neurologic deficits are appreciated. 5 out of 5 strength bilateral lower extremities. Able to range fully without any groin pain. Skin:  Skin is warm, dry and intact. No rash noted. Psychiatric: Odd affect. Answers questions tangentially.  ____________________________________________   LABS (all labs ordered  are listed, but only abnormal results are displayed)  Labs Reviewed  COMPREHENSIVE METABOLIC PANEL - Abnormal; Notable for the following:    Sodium 132 (*)    Chloride 99 (*)    Glucose, Bld 113 (*)    BUN 29 (*)    Calcium 8.8 (*)    Total Bilirubin 0.2 (*)    All other components within normal limits  CBC WITH DIFFERENTIAL/PLATELET - Abnormal; Notable for the following:    Lymphs Abs 3.9 (*)    All other components within normal limits  ACETAMINOPHEN LEVEL - Abnormal; Notable for the following:    Acetaminophen (Tylenol), Serum <10 (*)    All other components within normal limits  ETHANOL  SALICYLATE LEVEL  URINE DRUG SCREEN, QUALITATIVE ([facility] ONLY)  URINALYSIS COMPLETEWITH MICROSCOPIC ([facility] ONLY)   ____________________________________________  EKG   ____________________________________________  RADIOLOGY   ____________________________________________   PROCEDURES  ____________________________________________   INITIAL IMPRESSION / ASSESSMENT AND PLAN / ED COURSE  Pertinent labs & imaging results that were available during my care of the patient were reviewed by me and considered in my medical decision making (see chart for details).  IVC upheld. Psychiatry to see. ____________________________________________   FINAL CLINICAL IMPRESSION(S) / ED DIAGNOSES  Psychosis.    Myrna Blazer, MD 08/24/15 [phone]

## 2015-08-24 NOTE — ED Notes (Signed)
Report given to Leda RoysSherrie Smith, RN -BMU

## 2015-08-24 NOTE — BH Assessment (Signed)
Assessment Completed  [facility] Attending Physician/Psycharist (Dr. Jennet MaduroPucilowska) and recommends Psychiatric Inpatient Treatment and will put admission orders in.  ER MD (Schaevitz) informed of this decision.

## 2015-08-24 NOTE — ED Notes (Signed)
Urine sent to lab 

## 2015-08-24 NOTE — ED Notes (Addendum)
Key for Monica Pope's belongings placed in the pyxis of the main ed by this Rn, key #9, under patient specific meds
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
## 2015-08-24 NOTE — ED Notes (Addendum)
Patient upset going down stairs to BMU. Patient states she has her dogs and there is no one she can call to take care of them while she is here. This Clinical research associatewriter told patient she can talk with her doctor about that.

## 2015-08-24 NOTE — Consult Note (Signed)
Ms. Monica Pope is too sleepy to be interviewed. I am still waiting for UA and UDS.  She will likely be admitted when medically cleared.

## 2015-08-24 NOTE — ED Notes (Signed)
TTS at bedside. 

## 2015-08-24 NOTE — Consult Note (Signed)
Will admit to psychiatry. Orders in place.

## 2015-08-24 NOTE — ED Notes (Signed)
Patient is irritable but thought processes are clearer.  Requesting d/c.

## 2015-08-24 NOTE — ED Notes (Signed)
Sandwich and soft drink given.  

## 2015-08-25 DIAGNOSIS — F19950 Other psychoactive substance use, unspecified with psychoactive substance-induced psychotic disorder with delusions: Secondary | ICD-10-CM

## 2015-08-25 NOTE — [facility] Suicide Risk Assessment (Signed)
[facility] Admission Suicide Risk Assessment   Nursing information obtained from:    Demographic factors:    Current Mental Status:    Loss Factors:    Historical Factors:    Risk Reduction Factors:     Total Time spent with patient: 1 hour Principal Problem: Substance-induced psychotic disorder with delusions (HCC) Diagnosis:   Patient Active Problem List   Diagnosis Date Noted  . Substance-induced psychotic disorder with delusions (HCC) [F19.950] 08/24/2015  . Opioid use disorder, moderate, dependence (HCC) [F11.20] 08/24/2015  . Cocaine use disorder, moderate, dependence (HCC) [F14.20] 08/24/2015  . Tobacco use disorder [F17.200] 08/24/2015  . GERD (gastroesophageal reflux disease) [K21.9] 08/24/2015   Subjective Data: Paranoid delusions, psychotic disorganization, substance abuse.  Continued Clinical Symptoms:  Alcohol Use Disorder Identification Test Final Score (AUDIT): 0 The "Alcohol Use Disorders Identification Test", Guidelines for Use in Primary Care, Second Edition.  World Science writer [facility]). Score between 0-7:  no or low risk or alcohol related problems. Score between 8-15:  moderate risk of alcohol related problems. Score between 16-19:  high risk of alcohol related problems. Score 20 or above:  warrants further diagnostic evaluation for alcohol dependence and treatment.   CLINICAL FACTORS:   Alcohol/Substance Abuse/Dependencies Chronic Pain Currently Psychotic   Musculoskeletal: Strength & Muscle Tone: within normal limits Gait & Station: normal Patient leans: N/A  Psychiatric Specialty Exam: Review of Systems  Musculoskeletal: Positive for joint pain.  Psychiatric/Behavioral: Positive for hallucinations and substance abuse.  All other systems reviewed and are negative.   Blood pressure 129/64, pulse 53, temperature 98.2 F (36.8 C), temperature source Oral, resp. rate 0, height  (1.575 m), weight 59.875 kg (132 lb).Body mass index is 24.14  kg/(m^2).  General Appearance: Disheveled  Eye Contact::  Minimal  Speech:  Pressured  Volume:  Increased  Mood:  Angry, Dysphoric and Irritable  Affect:  Congruent  Thought Process:  Disorganized  Orientation:  Full (Time, Place, and Person)  Thought Content:  Delusions and Paranoid Ideation  Suicidal Thoughts:  No  Homicidal Thoughts:  No  Memory:  Immediate;   Fair Recent;   Fair Remote;   Fair  Judgement:  Poor  Insight:  Lacking  Psychomotor Activity:  Normal  Concentration:  Fair  Recall:  [employer] of Knowledge:Fair  Language: Fair  Akathisia:  No  Handed:  Right  AIMS (if indicated):     Assets:  Communication Skills Desire for Improvement Financial Resources/Insurance Housing Resilience  Sleep:     Cognition: WNL  ADL's:  Intact    COGNITIVE FEATURES THAT CONTRIBUTE TO RISK:  None    SUICIDE RISK:   Moderate:  Frequent suicidal ideation with limited intensity, and duration, some specificity in terms of plans, no associated intent, good self-control, limited dysphoria/symptomatology, some risk factors present, and identifiable protective factors, including available and accessible social support.  PLAN OF CARE: Hospital admission, medication management, and substance abuse counseling, discharge planning.  Ms. Stankovic is a 55 year old female with unknown psychiatric history admitted for a psychotic break in the context of substance abuse.   1. Agitation. This has resolved.  2. Psychosis. We will start low dose Risperdal.  3. Chronic pain. The patient claims that she has been prescribed morphine 30 mg 3 times daily for foot pain stemming from back surgery. She reports that she uses [facility] in [place]. We called the pharmacy yesterday to learn that she has not filled prescriptions since January. She is also taking Cymbalta and Neurontin  at night. We will offer Suboxone today and try to contact her prescribing orthopedic surgeon tomorrow.   4.  GERD. She is on Protonix.   5. Smoking. She is on nicotine patch.  6. Insomnia. She is on trazodone.  7. Allergies. She is on Flonase and Claritin.  8. Constipation. She is on Colace.  9. Substance abuse. She was positive for cocaine and opiates on admission. She minimizes her problems and declines treatment.  10. Social. The patient reports that she has 20 dogs at home. The police called us and apparently they have a neighbor coming to feed the dogs.  11 disposition. She will be discharged to home. She will need a follow-up appointment with a psychiatrist.    I certify that services furnished can reasonably be expected to improve the patient's condition.   ebrexthank you 08/25/2015, 10:32 AM

## 2015-08-25 NOTE — BHH Group Notes (Signed)
[facility] Group Notes:  (Nursing/MHT/Case Management/Adjunct)  Date:  08/25/2015  Time:  9:02 AM  Type of Therapy:  Goal setting   Participation Level:  Did Not Attend  Twanna Hymanda C Jerron Niblack 08/25/2015, 9:02 AM

## 2015-08-25 NOTE — Plan of Care (Signed)
Problem: Ineffective individual coping Goal: STG: Patient will remain free from self harm Outcome: Progressing Patient remains free from self harm currently     

## 2015-08-25 NOTE — Tx Team (Signed)
Initial Interdisciplinary Treatment Plan   PATIENT STRESSORS: Health problems Medication change or noncompliance Substance abuse   PATIENT STRENGTHS: Capable of independent living Communication skills Motivation for treatment/growth   PROBLEM LIST: Problem List/Patient Goals Date to be addressed Date deferred Reason deferred Estimated date of resolution  Substance abuse 08/25/2015     Depression 08/25/2015     Insomnia 08/25/2015                                          DISCHARGE CRITERIA:  Ability to meet basic life and health needs Adequate post-discharge living arrangements Improved stabilization in mood, thinking, and/or behavior  PRELIMINARY DISCHARGE PLAN: Return to previous living arrangement  PATIENT/FAMIILY INVOLVEMENT: This treatment plan has been presented to and reviewed with the patient, Ventura BrunsNancy B Nixon.  The patient and family have been given the opportunity to ask questions and make suggestions.  Polly CobiaSherrie Y Dearion Huot 08/25/2015, 3:45 AM

## 2015-08-25 NOTE — [facility] Group Notes (Signed)
[facility] LCSW Group Therapy  08/25/2015 4:24 PM   Type of Therapy:  Group Therapy  Participation Level:  Did Not Attend  Modes of Intervention:  Discussion, Education, Socialization and Support  Summary of Progress/Problems:. Todays topic: Grudges  Patients will be encouraged to discuss their thoughts, feelings, and behaviors as to why one holds on to grudges and reasons why people have grudges. Patients will process the impact of grudges on their daily lives and identify thoughts and feelings related to holding grudges. Patients will identify feelings and thoughts related to what life would look like without grudges.   Oz Gammel L Miyoshi Ligas MSW, LCSWA  08/25/2015, 4:25 PM     
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
## 2015-08-25 NOTE — Progress Notes (Signed)
D: Patient admitted to unit under services of Dr. Jennet MaduroPucilowska. Patient admitted for with bizarre behavior. A: Patient searched with no contraband found, skin assessment done . Patient oriented to unit. Informed by nurse to come to station for any concerns. Patient tearful on admission and is concerned about her "22 dogs starving while she is in the hospital". R: Patient verbalized understanding , voice no other concerns .

## 2015-08-25 NOTE — Progress Notes (Signed)
D:  Patient is alert and oriented on the unit this shift.  Patient did not attend groups today.  Patient denies suicidal ideation, homicidal ideation, auditory or visual hallucinations at the present time.  Patient reports that her feet hurt badly and she cannot walk or get out of bed at this time.  Patient is notified that arrangements are being made so that her dogs at home are being fed and cared for while she is in the hospital today. A:  Scheduled medications are administered to patient as per MD orders.  Emotional support and encouragement are provided.  Patient is maintained on q.15 minute safety checks.  Patient is informed to notify staff with questions or concerns. R:  No adverse medication reactions are noted.  Patient is cooperative with medication administration although very concerned with what medication she is getting and reporting that she is not getting the medication that she needs at this time (morphine).  Patient is staying in her bed during this shift.  Patient does not interact with others on the unit this shift.  Patient remains safe at this time.

## 2015-08-25 NOTE — Plan of Care (Signed)
Problem: Ineffective individual coping Goal: LTG: Patient will report a decrease in negative feelings Outcome: Not Progressing Patient does not report feeling better today than she did yesterday

## 2015-08-26 MED ORDER — MORPHINE SULFATE ER 15 MG PO TBCR
30.0000 mg | EXTENDED_RELEASE_TABLET | Freq: Two times a day (BID) | ORAL | Status: DC
  Administered 2015-08-26 – 2015-08-27 (×3): 30 mg via ORAL
  Filled 2015-08-26 (×3): qty 2

## 2015-08-26 MED ORDER — RISPERIDONE 2 MG PO TABS: 2.0000 mg | ORAL_TABLET | Freq: Every day | ORAL | Status: AC

## 2015-08-26 MED ORDER — TRAZODONE HCL 100 MG PO TABS: 100.0000 mg | ORAL_TABLET | Freq: Every day | ORAL | Status: AC

## 2015-08-26 MED ORDER — RISPERIDONE 1 MG PO TABS
2.0000 mg | ORAL_TABLET | Freq: Every day | ORAL | Status: DC
  Filled 2015-08-26: qty 2
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
## 2015-08-26 NOTE — Plan of Care (Signed)
Problem: Ineffective individual coping Goal: STG: Patient will remain free from self harm Outcome: Progressing Denies suicidal ideations.     

## 2015-08-26 NOTE — [facility] Suicide Risk Assessment (Signed)
[facility]   Principal Problem: Substance-induced psychotic disorder with delusions [facility](HCC) Discharge Diagnoses:  Patient Active Problem List   Diagnosis Date Noted  . Substance-induced psychotic disorder with delusions (HCC) [F19.950] 08/24/2015  . Opioid use disorder, moderate, dependence (HCC) [F11.20] 08/24/2015  . Cocaine use disorder, moderate, dependence (HCC) [F14.20] 08/24/2015  . Tobacco use disorder [F17.200] 08/24/2015  . GERD (gastroesophageal reflux disease) [K21.9] 08/24/2015    Total Time spent with patient: 30 minutes  Musculoskeletal: Strength & Muscle Tone: within normal limits Gait & Station: normal Patient leans: N/A  Psychiatric Specialty Exam: Review of Systems  Musculoskeletal: Positive for myalgias.  All other systems reviewed and are negative.   Blood pressure 124/79, pulse 76, temperature 98.5 F (36.9 C), temperature source Oral, resp. rate 18, height 5\' 2"  (1.575 m), weight 59.875 kg (132 lb).Body mass index is 24.14 kg/(m^2).  General Appearance: Fairly Groomed  Patent attorneyye Contact::  Fair  Speech:  Clear and Coherent409  Volume:  Normal  Mood:  Dysphoric  Affect:  Appropriate  Thought Process:  Goal Directed  Orientation:  Full (Time, Place, and Person)  Thought Content:  Delusions and Paranoid Ideation  Suicidal Thoughts:  No  Homicidal Thoughts:  No  Memory:  Immediate;   Fair Recent;   Fair Remote;   Fair  Judgement:  Fair  Insight:  Shallow  Psychomotor Activity:  Normal  Concentration:  Fair  Recall:  FiservFair  Fund of Knowledge:Fair  Language: Fair  Akathisia:  No  Handed:  Right  AIMS (if indicated):     Assets:  Communication Skills Desire for Improvement Financial Resources/Insurance Housing Resilience Social Support  Sleep:     Cognition: WNL  ADL's:  Intact   Mental Status Per Nursing Assessment::   On Admission:     Demographic Factors:  Divorced or widowed, Caucasian and Living alone  Loss  Factors: Decline in physical health and Financial problems/change in socioeconomic status  Historical Factors: Impulsivity  Risk Reduction Factors:   Sense of responsibility to family and Positive social support  Continued Clinical Symptoms:  Chronic Pain Currently Psychotic  Cognitive Features That Contribute To Risk:  None    Suicide Risk:  Minimal: No identifiable suicidal ideation.  Patients presenting with no risk factors but with morbid ruminations; may be classified as minimal risk based on the severity of the depressive symptoms  Follow-up Information    Follow up with [facility]. Go on 08/28/2015.   Why:  For follow-up care appt walk in on Wednesday 08/28/15 at 8:00am for assessment   Contact information:   [address] [place], [place] [phone] Fax [phone]      Plan Of Care/Follow-up recommendations:  Activity:  as tolerated. Diet:  low sodium heart healthy. Other:  keep follow up appointments.  Kristine LineaJolanta Keeana Pieratt, MD 08/26/2015, 8:52 PM

## 2015-08-26 NOTE — Progress Notes (Signed)
Patient was sad,irritable & tearful this morning.Stated that she need to go home & take care of her dogs.States "you people are not helping me.putting me in trouble."Later this afternoon patient came to staff & apologized for being rude this morning.She is intrusive at times but redirectable.Compliant with medications.Attended groups.

## 2015-08-26 NOTE — Progress Notes (Signed)
Recreation Therapy Notes  Date: 04.10.17 Time: 1:00 pm Location: Craft Room  Group Topic: Wellness  Goal Area(s) Addresses:  Patient will identify at least one item per dimension of health. Patient will examine areas they are deficient in.  Behavioral Response: Did not attend  Intervention: 6 Dimensions of Health  Activity: Patients were given a definition sheet with the 6 Dimensions of Health on it. Patients were given a worksheet with the 6 Dimensions of Health on it and were encouraged to think of 2-3 things they are currently doing in each dimension.  Education: LRT educated patients on ways they can increase each dimension.  Education Outcome: Patient did not attend group.   Clinical Observations/Feedback: Patient did not attend group.  Jacci Ruberg M, LRT/CTRS 08/26/2015 2:19 PM 

## 2015-08-26 NOTE — Plan of Care (Signed)
Problem: Ineffective individual coping Goal: STG: Patient will remain free from self harm Outcome: Progressing Pt safe on the unit at this time     

## 2015-08-26 NOTE — [facility] Group Notes (Signed)
[facility] LCSW Group Therapy  08/26/2015 4:32 PM  Type of Therapy:  Group Therapy  Participation Level:  Active  Participation Quality:  Intrusive, Monopolizing and Redirectable  Affect:  Anxious, Labile and Tearful  Cognitive:  Disorganized, Possibly delusional, difficult to tell as this SW is not aware of her social situation.  Insight:  Poor  Engagement in Therapy:  Limited  Modes of Intervention:  Activity, Discussion, Problem-solving, Socialization and Support  Summary of Progress/Problems:Utilized Wheel of Fortune activity to introduce topic of the day.  Pt attended and participated in group discussion around overcoming obstacles, Pt able to share some obstacles they are facing and as a group examined the way our thoughts contribute to the way obstacles are viewed.  Practiced identifying unhelpful thoughts and how these can become triggers at times.  Pt tangential at times about her plan to discharge to attorney and friend and having time to get someone to help her with her many dogs.  Glennon MacLaws, Mieke Brinley P, MSW, LCSW 08/26/2015, 4:32 PM

## 2015-08-26 NOTE — [facility] Group Notes (Signed)
[facility] Group Notes:  (Nursing/MHT/Case Management/Adjunct)  Date:  08/26/2015  Time:  3:11 AM  Type of Therapy:  Group Therapy  Participation Level:  Did Not Attend   Summary of Progress/Problems:  Monica Pope Imani Heran Campau 08/26/2015, 3:11 AM
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
## 2015-08-26 NOTE — Progress Notes (Signed)
D: Patient appears anxious. She is fixated on her dogs and getting them fed. Her keys and credit card were returned to her friend feed the dogs. She denies SI/HI/AVH. She has little insight and refuses needing to be her. She chronic pain in her feet.  A: Medication given with education. Encouragement provided. PRN tylenol given.  R: Patient compliant with medication. She has remained calm and cooperative. Safety maintained with 15 min checks.

## 2015-08-26 NOTE — [facility] Group Notes (Signed)
[facility] Group Notes:  (Nursing/MHT/Case Management/Adjunct)  Date:  08/26/2015  Time:  10:06 PM  Type of Therapy:  Evening Wrap-up Group  Participation Level:  Active  Participation Quality:  Appropriate and Attentive  Affect:  Appropriate  Cognitive:  Alert and Appropriate  Insight:  Appropriate and Good  Engagement in Group:  Engaged  Modes of Intervention:  Discussion  Summary of Progress/Problems:  Monica MorrowChelsea Nanta Kieffer Pope 08/26/2015, 10:06 PM

## 2015-08-26 NOTE — [facility] Suicide Risk Assessment (Signed)
[facility] INPATIENT:  Family/Significant Other Suicide Prevention Education  Suicide Prevention Education:  Patient Refusal for Family/Significant Other Suicide Prevention Education: The patient Monica Pope has refused to provide written consent for family/significant other to be provided Family/Significant Other Suicide Prevention Education during admission and/or prior to discharge.  Physician notified.  Lulu RidingIngle, Branden Vine T, MSW, LCSW 08/26/2015, 4:09 PM

## 2015-08-26 NOTE — [facility] Counselor (Signed)
Adult Comprehensive Assessment  Patient ID: Monica Pope, female   DOB: [birth year], 55 y.o.   MRN: [id number]  Information Source: Information source: Patient  Current Stressors:  Bereavement / Loss: mom died Sept 2013, dad died Jan 2015, best friend passed away May 27 2015 to breast cancer  Living/Environment/Situation:  Living Arrangements: Alone How long has patient lived in current situation?: 2015 What is atmosphere in current home: Comfortable  Family History:  Marital status: Divorced Divorced, when?: 2004 Does patient have children?: Yes How many children?: 2 How is patient's relationship with their children?: 55 yo son in [employer], I dont socialize with my DTR  Childhood History:  By whom was/is the patient raised?: Both parents Description of patient's relationship with caregiver when they were a child: good relationship growing up Patient's description of current relationship with people who raised him/her: parents passed away Does patient have siblings?: No Did patient suffer any verbal/emotional/physical/sexual abuse as a child?: No Did patient suffer from severe childhood neglect?: No Has patient ever been sexually abused/assaulted/raped as an adolescent or adult?: No Was the patient ever a victim of a crime or a disaster?: No Witnessed domestic violence?: No Has patient been effected by domestic violence as an adult?: No  Education:  Highest grade of school patient has completed: BA from [employer] Currently a student?: No Learning disability?: No  Employment/Work Situation:   Employment situation: On disability Why is patient on disability: feet and legs and back have bolts How long has patient been on disability: 2011 Patient's job has been impacted by current illness: Yes Describe how patient's job has been impacted: cant work because of pain What is the longest time patient has a held a job?: 5353yrs Where was the patient employed at that time?: [employer] [place] Has patient ever been in the [employer] and Companymilitary?: No Has patient ever served in combat?: No Did You Receive Any Psychiatric Treatment/Services While in [employer] Military?: No Are There Guns or Other Weapons in Your Home?: Yes Types of Guns/Weapons: 2 shotguns, 25 rifle and 25 handgun  Financial Resources:   Financial resources: [employer] SSDI  Alcohol/Substance Abuse:   What has been your use of drugs/alcohol within the last 12 months?: reports she didnt know pain patch had cocain in it Alcohol/Substance Abuse Treatment Hx: Denies past history Has alcohol/substance abuse ever caused legal problems?: No  Social Support System:   Conservation officer, natureatient's Community Support System: Fair Museum/gallery exhibitions officerDescribe Community Support System: friend Type of faith/religion: Catholic How does patient's faith help to cope with current illness?: pray, go to church  Leisure/Recreation:   Leisure and Hobbies: dogs are my hobby  Strengths/Needs:   What things does the patient do well?: talking with people In what areas does patient struggle / problems for patient: too many bad things happening  Discharge Plan:   Does patient have access to transportation?: Yes (friend may be able to offer transport) Will patient be returning to same living situation after discharge?: Yes Currently receiving community mental health services: No If no, would patient like referral for services when discharged?: Yes (What county?) [facility]([place]) Does patient have financial barriers related to discharge medications?: No  Summary/Recommendations:   Summary and Recommendations (to be completed by the evaluator): Patient is a divorced 55 year old Caucasian female admitted with a diagnosis of Substance-induced psychotic disorder with delusions . Patient presented to the hopital by police after calling several times reporting someone had been in her shop as the alarm was going off. Patient was  positive for coacine and reports she took a patch for pain  relief and believes it to be cocaine. Patient reports primary triggers are loss of her mother, father, and best friend all since 2014. Patient refuses family consent but would like a referral to [facility] [phone] for outpatient mental health follow up. Patient lives alone and reports she has a son who is 56 yeas old and in the [place]. Patient receives disability and has Medicaid. Patient will benefit from crisis stabilization, medication evaluation, group therapy and psycho education in addition to  case management for dischaerge planning. At discharge, it is recommended that patient remain compliant with established discharge plan and continued treatment.  Lulu Riding., MSW, [facility]  08/26/2015  [phone]

## 2015-08-26 NOTE — Progress Notes (Signed)
D: Pt denies SI/HI/AVH. Pt is labile and irritable. Pt stated she has been trying to ge "Marisue IvanLiz" phone number so she can contact her to get her house key because Marisue IvanLiz is the one watching her dogs .   A: Pt was offered support and encouragement. Pt was given scheduled medications. Pt was encourage to attend groups. Q 15 minute checks were done for safety.   R: Pt is taking medication.Pt receptive to treatment and safety maintained on unit.

## 2015-08-27 NOTE — Tx Team (Signed)
Interdisciplinary Treatment Plan Update (Adult)  Date:  08/27/2015 Time Reviewed:  2:10 PM  Progress in Treatment: Attending groups: No. Participating in groups:  No. Taking medication as prescribed:  Yes. Tolerating medication:  Yes. Family/Significant othe contact made:  No, will contact:  if patient provides consent Patient understands diagnosis:  Yes. Discussing patient identified problems/goals with staff:  Yes. Medical problems stabilized or resolved:  Yes. Denies suicidal/homicidal ideation: Yes. Issues/concerns per patient self-inventory:  Yes. Other:  New problem(s) identified: Yes, Describe:  patient has dogs that were reported to nurse to be taken to the animal shelter  Discharge Plan or Barriers: Patient will stabilize on medications and discharge home with outpatient follow up in [facility].  Reason for Continuation of Hospitalization: Mania Medication stabilization Withdrawal symptoms Other; describe paranoia  Comments:  Estimated length of stay: 0 days, will discharge today 08/27/15  New goal(s):  Review of initial/current patient goals per problem list:   1.  Goal(s): Participate in aftercare plan    Met:  Yes  Target date: by discharge  As evidenced by: patient will participate in aftercare plan AEB aftercare provider and housing plan identified at discharge 08/27/15: Patient will return home at discharge and follow up outpatient in [facility] for mental health services. Goal met  2.  Goal (s): Decrease mania    Met:  Yes  Target date: by discharge  As evidenced by: patient demonstrates decreased symptoms of mania  08/27/15: Patient is stable for discharge per MD.   3.  Goal(s): Decrease psychosis    Met:  Yes  Target date: by discharge  As evidenced by: patient demonstrates decreased symptoms of psychosis  08/27/15: Patient is stable for discharge per MD.   4.  Goal(s): Patient will demonstrate decreased signs of withdrawal due to  substance abuse   Met:  Yes  Target date: by discharge  As evidenced by: Patient will produce a CIWA/COWS score of 0, have stable vitals signs, and no symptoms of withdrawal 08/27/15: Patient is stable for discharge per MD.   Attendees: Physician:  Orson Slick, MD 4/11/20172:10 PM  Nursing:   Polly Cobia, RN 4/11/20172:10 PM  Other:  Carmell Austria, LCSW 4/11/20172:10 PM  Other:  Dossie Arbour, LCSW 4/11/20172:10 PM  Other:  Garald Braver, Ph D 4/11/20172:10 PM  Other: Abran Cantor, RN 4/11/20172:10 PM  Other:  4/11/20172:10 PM  Other:  4/11/20172:10 PM  Other:  4/11/20172:10 PM  Other:  4/11/20172:10 PM  Other:  4/11/20172:10 PM  Other:   4/11/20172:10 PM   Scribe for Treatment Team:   Keene Breath, MSW, LCSW  08/27/2015, 2:10 PM

## 2015-08-27 NOTE — Progress Notes (Signed)
Patient denies SI/HI, denies A/V hallucinations. Patient verbalizes understanding of discharge instructions, follow up care and prescriptions. Patient given all belongings from  locker. Patient escorted out by staff, transported by cab. 

## 2015-08-27 NOTE — Progress Notes (Signed)
  [facility]  Will you be returning to the same living situation after discharge:  Yes,  back to her home At discharge, do you have transportation home?: No. Cab voucher provided Do you have the ability to pay for your medications: Yes,  patient has Medicaid  Release of information consent forms completed and in the chart;  Patient's signature needed at discharge.  Patient to Follow up at: Follow-up Information    Follow up with [facility]. Go on 08/28/2015.   Why:  For follow-up care appt walk in on Wednesday 08/28/15 at 8:00am for assessment. Arrive early to ensure you will be seen that day. Call Lorella NimrodHarvey once discharged from hospital.   Contact information:   [address] [place] Ph [phone] Fax [phone] Lorella NimrodHarvey [phone]      Next level of care provider [facility]  Safety Planning and Suicide Prevention discussed: Yes,  SPE discussed with patient but patient refused further SPE  Have you used any form of tobacco in the last 30 days? (Cigarettes, Smokeless Tobacco, Cigars, and/or Pipes): Yes  Has patient been referred to the Quitline?: Patient refused referral  Patient has been referred for addiction treatment: Yes, outpatient services   Lulu RidingIngle, Bernadette Armijo T, MSW, LCSW 08/27/2015, 2:16 PM [phone]

## 2016-09-18 ENCOUNTER — Other Ambulatory Visit: Payer: Self-pay | Admitting: Nurse Practitioner

## 2016-09-18 DIAGNOSIS — Z1239 Encounter for other screening for malignant neoplasm of breast: Secondary | ICD-10-CM

## 2016-10-02 ENCOUNTER — Ambulatory Visit: Payer: Medicaid Other | Attending: Nurse Practitioner

## 2019-09-28 ENCOUNTER — Other Ambulatory Visit: Payer: Self-pay | Admitting: Family Medicine

## 2019-09-28 DIAGNOSIS — Z1231 Encounter for screening mammogram for malignant neoplasm of breast: Secondary | ICD-10-CM

## 2019-09-28 DIAGNOSIS — Z7689 Persons encountering health services in other specified circumstances: Secondary | ICD-10-CM

## 2020-08-19 ENCOUNTER — Other Ambulatory Visit: Payer: Self-pay | Admitting: Family Medicine

## 2020-08-19 DIAGNOSIS — Z1231 Encounter for screening mammogram for malignant neoplasm of breast: Secondary | ICD-10-CM
